# Patient Record
Sex: Female | Born: 1960 | Hispanic: Yes | Marital: Single | Smoking: Never smoker
Health system: Southern US, Community
[De-identification: ages and names within clinical notes are randomized; demographics above are authoritative.]

## PROBLEM LIST (undated history)

## (undated) DIAGNOSIS — E119 Type 2 diabetes mellitus without complications: Secondary | ICD-10-CM

## (undated) HISTORY — PX: EYE SURGERY: SHX253

## (undated) HISTORY — DX: Type 2 diabetes mellitus without complications: E11.9

---

## 2008-02-03 ENCOUNTER — Ambulatory Visit: Payer: Self-pay

## 2009-10-01 ENCOUNTER — Emergency Department: Payer: Self-pay | Admitting: Emergency Medicine

## 2011-08-13 ENCOUNTER — Emergency Department: Payer: Self-pay | Admitting: Internal Medicine

## 2012-02-04 ENCOUNTER — Ambulatory Visit: Payer: Self-pay

## 2014-07-04 ENCOUNTER — Ambulatory Visit: Payer: Self-pay

## 2017-01-27 ENCOUNTER — Ambulatory Visit: Payer: Self-pay | Attending: Oncology

## 2017-01-27 ENCOUNTER — Encounter (INDEPENDENT_AMBULATORY_CARE_PROVIDER_SITE_OTHER): Payer: Self-pay

## 2017-01-27 VITALS — BP 135/76 | HR 67 | Temp 98.7°F | Ht 62.0 in | Wt 174.0 lb

## 2017-01-27 DIAGNOSIS — N63 Unspecified lump in unspecified breast: Secondary | ICD-10-CM

## 2017-01-27 NOTE — Progress Notes (Signed)
Subjective:     Patient ID: Amber Marshall, female   DOB: 07/19/1961, 56 y.o.   MRN: [id number]  HPI   Review of Systems     Objective:   Physical Exam  Pulmonary/Chest: Right breast exhibits no inverted nipple, no mass, no nipple discharge, no skin change and no tenderness. Left breast exhibits tenderness. Left breast exhibits no inverted nipple, no mass, no nipple discharge and no skin change. Breasts are symmetrical.    Left axillary nodularity       Assessment:     56 year old hispanic female patient presents for BCCCP clinic visitPatient screened, and meets BCCCP eligibility.  Patient does not have insurance, Medicare or Medicaid.  Handout given on Affordable Care Act.  Instructed patient on breast self-exam using teach back method.  Patient states she has had left axillary tenderness x 2 months. States her primary provider at [facility] felt mass under left arm, and requested diagnostic mammogram and ultrasound.  Palpated axillary nodularity bilateral, more prominent on left.    Plan:     To be scheduled by Joellyn Quailshristy Burton for bilateral diagnostic mammogram, and ultrasound.

## 2017-02-06 ENCOUNTER — Encounter: Payer: Self-pay | Admitting: Radiology

## 2017-02-06 ENCOUNTER — Ambulatory Visit
Admission: RE | Admit: 2017-02-06 | Discharge: 2017-02-06 | Disposition: A | Discharge status: Home / Self Care | Payer: Self-pay | Source: Ambulatory Visit | Attending: Oncology | Admitting: Oncology

## 2017-02-06 ENCOUNTER — Other Ambulatory Visit: Payer: Self-pay

## 2017-02-06 DIAGNOSIS — N63 Unspecified lump in unspecified breast: Secondary | ICD-10-CM

## 2017-02-10 ENCOUNTER — Ambulatory Visit
Admission: RE | Admit: 2017-02-10 | Discharge: 2017-02-10 | Disposition: A | Discharge status: Home / Self Care | Payer: Self-pay | Source: Ambulatory Visit | Attending: Oncology | Admitting: Oncology

## 2017-02-10 DIAGNOSIS — N63 Unspecified lump in unspecified breast: Secondary | ICD-10-CM

## 2017-02-10 HISTORY — PX: BREAST BIOPSY: SHX20

## 2017-02-13 LAB — SURGICAL PATHOLOGY

## 2017-02-14 ENCOUNTER — Telehealth: Payer: Self-pay | Admitting: *Deleted

## 2017-02-14 NOTE — Telephone Encounter (Addendum)
Called patient via the interpreter, Amber Marshall.  Left her a message to return my call.  I also called her emergency contact and asked her to have the patient give me a call.  I have her benign biopsy report showing a plasma cell infiltrate.  Patient needs referral back to primary care for further evaluation per radiology.  I have scheduled the patient an appointment to see Dr. Hessie DienerBender at [facility] on Monday at 11:00.  Patient returned my call.  Reviewed her pathology report and need for further evaluation with her primary care physician.  She is aware of her appointment on Monday at 11:00.  Per Sheralyn Boatmanoni, at [facility], they are able to view results in Epic.

## 2017-02-16 NOTE — Progress Notes (Signed)
Copy to HSIS.  Molli PoseySheena Lambert RN scheduled patient appointment with Primary physician at [facility] for follow-up.

## 2018-07-29 ENCOUNTER — Encounter (INDEPENDENT_AMBULATORY_CARE_PROVIDER_SITE_OTHER): Payer: Self-pay

## 2018-07-29 ENCOUNTER — Ambulatory Visit: Payer: Self-pay | Attending: Oncology

## 2018-07-29 ENCOUNTER — Ambulatory Visit
Admission: RE | Admit: 2018-07-29 | Discharge: 2018-07-29 | Disposition: A | Discharge status: Home / Self Care | Payer: Self-pay | Source: Ambulatory Visit | Attending: Oncology | Admitting: Oncology

## 2018-07-29 VITALS — BP 105/63 | HR 70 | Temp 96.9°F | Ht 62.5 in | Wt 178.5 lb

## 2018-07-29 DIAGNOSIS — Z0000 Encounter for general adult medical examination without abnormal findings: Secondary | ICD-10-CM | POA: Insufficient documentation

## 2018-07-29 NOTE — Progress Notes (Signed)
  Subjective:     Patient ID: Amber Marshall, female   DOB: 07/13/1961, 57 y.o.   MRN: [id number]  HPI   Review of Systems     Objective:   Physical Exam  Pulmonary/Chest: Right breast exhibits no inverted nipple, no mass, no nipple discharge, no skin change and no tenderness. Left breast exhibits no inverted nipple, no mass, no nipple discharge, no skin change and no tenderness. Breasts are symmetrical.       Assessment:     57 year [facility] hispanic [facility]  [employer] remote interpreter used for translation.  Patient had a benign left breast lymph node biopsy in June 2018.  She also [facility] benign [facility] and [facility].  She is scheduled for a one [facility].  Patient screened, and meets BCCCP eligibility.  Patient does not have insurance, Medicare or Medicaid.  Handout given on Affordable Care Act.  Instructed patient on breast self awareness using teach back method.  Clinical breast exam unremarkable.  No mass or lump palpated.  Risk Assessment    Risk Scores      07/29/2018   Last edited by: Alta Corningover, Melissa G, CMA   5-year risk: 0.7 %   Lifetime risk: 4.7 %             Plan:     Sent for bilateral screening mammogram.

## 2018-07-30 NOTE — Progress Notes (Unsigned)
Letter mailed from [facility] to notify of normal mammogram results.  Patient to return in one year for annual screening.  Copy to HSIS. 

## 2019-07-19 ENCOUNTER — Other Ambulatory Visit: Payer: Self-pay

## 2019-07-19 NOTE — Progress Notes (Signed)
Due to Covid 19 a televisit was used to obtain patient's health history.  Amber Marshall #[id number] from the language line used for interpretation.  Verbal consent given to be in our BCCCP program.  Patient denies any breast problems.  Last pap per appointment notes was 08/25/18 with results of LGSIL.  Patient is aware of her appointment to see Dr. Amalia Hailey tomorrow at 1:30 for colposcopy and possible biopsy.  She was instructed to go directly to the [facility] for her mammogram tomorrow at 10:00.  Risk Assessment    Risk Scores      07/20/2019 07/29/2018   Last edited by: Rico Junker, RN Dover, Laddie Aquas, CMA   5-year risk: 0.8 % 0.7 %   Lifetime risk: 4.6 % 4.7 %

## 2019-07-20 ENCOUNTER — Ambulatory Visit: Payer: Self-pay | Attending: Oncology

## 2019-07-20 ENCOUNTER — Other Ambulatory Visit: Payer: Self-pay | Admitting: Obstetrics and Gynecology

## 2019-07-20 ENCOUNTER — Ambulatory Visit
Admission: RE | Admit: 2019-07-20 | Discharge: 2019-07-20 | Disposition: A | Discharge status: Home / Self Care | Payer: Self-pay | Source: Ambulatory Visit | Attending: Oncology | Admitting: Oncology

## 2019-07-20 ENCOUNTER — Encounter: Payer: Self-pay | Admitting: Obstetrics and Gynecology

## 2019-07-20 ENCOUNTER — Other Ambulatory Visit: Payer: Self-pay

## 2019-07-20 ENCOUNTER — Ambulatory Visit (INDEPENDENT_AMBULATORY_CARE_PROVIDER_SITE_OTHER): Payer: Self-pay | Admitting: Obstetrics and Gynecology

## 2019-07-20 VITALS — BP 125/67 | HR 69 | Ht 62.0 in | Wt 179.5 lb

## 2019-07-20 DIAGNOSIS — Z00129 Encounter for routine child health examination without abnormal findings: Secondary | ICD-10-CM

## 2019-07-20 DIAGNOSIS — R87612 Low grade squamous intraepithelial lesion on cytologic smear of cervix (LGSIL): Secondary | ICD-10-CM

## 2019-07-20 DIAGNOSIS — Z1231 Encounter for screening mammogram for malignant neoplasm of breast: Secondary | ICD-10-CM | POA: Insufficient documentation

## 2019-07-20 NOTE — Progress Notes (Signed)
Referring Provider:  BCCCP  HPI:  Amber Marshall is a 58 y.o.  No obstetric history on file.  who presents today for evaluation and management of abnormal cervical cytology.    Dysplasia History:  ASCUS Pos HPV 2019.   LGSIL 12/20  ROS:  Pertinent items are noted in HPI.  OB History  No obstetric history on file.    History reviewed. No pertinent past medical history.  Past Surgical History:  Procedure Laterality Date  . BREAST BIOPSY Left 02/10/2017   left axilla bx REACTIVE LYMPHADENOPATHY WITH PLASMA CELL INFILTRATE. NEGATIVE FOR MALIGNANCY     SOCIAL HISTORY: Social History   Substance and Sexual Activity  Alcohol Use Not Currently   Social History   Substance and Sexual Activity  Drug Use Not Currently     History reviewed. No pertinent family history.  ALLERGIES:  Patient has no allergy information on record.  She currently has no medications in their medication list.  Physical Exam: -Vitals:  BP 125/67   Pulse 69   Ht 5\' 2"  (1.575 m)   Wt 179 lb 8 oz (81.4 kg)   BMI 32.83 kg/m  GEN: WD, WN, NAD.  A+ O x 3, good mood and affect.   PROCEDURE: 1.  Urine Pregnancy Test:  not done 2.  Colposcopy performed with 4% acetic acid after verbal consent obtained                           -Aceto-white Lesions Location(s): Circumferential              -Biopsy performed at 5&10 o'clock               -ECC indicated and performed: No.     -Biopsy sites made hemostatic with pressure and Monsel's solution   -Satisfactory colposcopy: Yes.      -Evidence of Invasive cervical CA :  NO  ASSESSMENT:  Amber Marshall is a 58 y.o. No obstetric history on file. here for  1. Low grade squamous intraepithelial lesion on cytologic smear of cervix (LGSIL)   .  PLAN: 1.  I discussed the grading system of pap smears and HPV high risk viral types.  We will discuss management after colpo results return.  No orders of the defined types were placed in this  encounter.          F/U  Return in about 2 weeks (around 08/03/2019) for Colpo f/u.  Jeannie Fend ,MD 07/20/2019,2:11 PM

## 2019-07-23 NOTE — Progress Notes (Signed)
Letter mailed from [facility] to notify of normal mammogram results.  Patient to return in one year for annual screening.  Results from cervical biopsy pending.  Patient scheduled to return 08/02/20 for screening and follow-pap per Dr. Amalia Hailey recommendation post benign biopsy results.

## 2019-07-26 LAB — ANATOMIC PATHOLOGY REPORT: PDF Image: 0

## 2019-08-04 ENCOUNTER — Encounter: Payer: Self-pay | Admitting: Obstetrics and Gynecology

## 2019-08-04 ENCOUNTER — Other Ambulatory Visit: Payer: Self-pay

## 2019-08-04 ENCOUNTER — Ambulatory Visit (INDEPENDENT_AMBULATORY_CARE_PROVIDER_SITE_OTHER): Payer: PRIVATE HEALTH INSURANCE | Admitting: Obstetrics and Gynecology

## 2019-08-04 VITALS — BP 113/64 | HR 67 | Wt 173.3 lb

## 2019-08-04 DIAGNOSIS — R87612 Low grade squamous intraepithelial lesion on cytologic smear of cervix (LGSIL): Secondary | ICD-10-CM

## 2019-08-04 NOTE — Progress Notes (Signed)
HPI:      [name] is a 58 y.o. No obstetric history on file. who LMP was No LMP recorded. Patient is postmenopausal.  Subjective:   She presents today for follow-up of her colposcopy for LGSIL.  1 year ago she had positive HPV.  Although she had some acetowhite changes noted on colposcopy it was not significantly abnormal.    Hx: The following portions of the patient's history were reviewed and updated as appropriate:             She  has no past medical history on file. She does not have a problem list on file. She  has a past surgical history that includes Breast biopsy (Left, 02/10/2017). Her family history is not on file. She  reports that she has never smoked. She has never used smokeless tobacco. She reports previous alcohol use. She reports previous drug use. She currently has no medications in their medication list. She has no allergies on file.       Review of Systems:  Review of Systems  Constitutional: Denied constitutional symptoms, night sweats, recent illness, fatigue, fever, insomnia and weight loss.  Eyes: Denied eye symptoms, eye pain, photophobia, vision change and visual disturbance.  Ears/Nose/Throat/Neck: Denied ear, nose, throat or neck symptoms, hearing loss, nasal discharge, sinus congestion and sore throat.  Cardiovascular: Denied cardiovascular symptoms, arrhythmia, chest pain/pressure, edema, exercise intolerance, orthopnea and palpitations.  Respiratory: Denied pulmonary symptoms, asthma, pleuritic pain, productive sputum, cough, dyspnea and wheezing.  Gastrointestinal: Denied, gastro-esophageal reflux, melena, nausea and vomiting.  Genitourinary: Denied genitourinary symptoms including symptomatic vaginal discharge, pelvic relaxation issues, and urinary complaints.  Musculoskeletal: Denied musculoskeletal symptoms, stiffness, swelling, muscle weakness and myalgia.  Dermatologic: Denied dermatology symptoms, rash and scar.  Neurologic:  Denied neurology symptoms, dizziness, headache, neck pain and syncope.  Psychiatric: Denied psychiatric symptoms, anxiety and depression.  Endocrine: Denied endocrine symptoms including hot flashes and night sweats.   Meds:   No current outpatient medications on file prior to visit.   No current facility-administered medications on file prior to visit.    Objective:     Vitals:   08/04/19 1350  BP: 113/64  Pulse: 67              Colposcopically directed biopsies revealed no abnormalities/no dysplasia.  Assessment:    No obstetric history on file. There are no problems to display for this patient.    1. Low grade squamous intraepithelial lesion on cytologic smear of cervix (LGSIL)     Colposcopy negative for dysplasia   Plan:            1.  Recommend follow-up Pap smear in 1 year with HPV testing at that time.  Will refer back to [facility] for follow-up Pap.   Orders No orders of the defined types were placed in this encounter.   No orders of the defined types were placed in this encounter.     F/U  No follow-ups on file. I spent 18 minutes involved in the care of this patient of which greater than 50% was spent discussing history of abnormal Pap smears, findings at colposcopy recommended future follow-ups the relationship of HPV and cervical cancer/dysplasia.  All questions answered.  Finis Bud, M.D. 08/04/2019 1:55 PM

## 2019-08-04 NOTE — Progress Notes (Signed)
Patient comes in today for colpo results.  

## 2019-11-05 ENCOUNTER — Ambulatory Visit: Payer: Self-pay

## 2019-11-06 ENCOUNTER — Ambulatory Visit: Payer: Self-pay | Attending: Internal Medicine

## 2019-11-06 DIAGNOSIS — Z23 Encounter for immunization: Secondary | ICD-10-CM

## 2019-11-06 NOTE — Progress Notes (Signed)
   Covid-19 Vaccination Clinic  Name:  Niveah Boerner    MRN: [id number] DOB: 20-Jan-1961  11/06/2019  Ms. Lourie Retz was observed post Covid-19 immunization for 15 minutes without incident. She was provided with Vaccine Information Sheet and instruction to access the V-Safe system.   Ms. Sahra Converse was instructed to call 911 with any severe reactions post vaccine: Marland Kitchen Difficulty breathing  . Swelling of face and throat  . A fast heartbeat  . A bad rash all over body  . Dizziness and weakness   Immunizations Administered    Name Date Dose VIS Date Route   Pfizer COVID-19 Vaccine 11/06/2019  3:54 PM 0.3 mL 07/30/2019 Intramuscular   Manufacturer: [employer], [employer]   Lot: [id number]   NDC: [address]-8

## 2019-11-27 ENCOUNTER — Ambulatory Visit: Payer: Self-pay | Attending: Internal Medicine

## 2019-11-27 DIAGNOSIS — Z23 Encounter for immunization: Secondary | ICD-10-CM

## 2019-11-27 NOTE — Progress Notes (Signed)
   Covid-19 Vaccination Clinic  Name:  Kameah Rawl    MRN: [id number] DOB: 13-Jun-1961  11/27/2019  Ms. Lourie Retz was observed post Covid-19 immunization for 15 minutes without incident. She was provided with Vaccine Information Sheet and instruction to access the V-Safe system. Medical Interpreter used.  Ms. Dellanira Dillow was instructed to call 911 with any severe reactions post vaccine: Marland Kitchen Difficulty breathing  . Swelling of face and throat  . A fast heartbeat  . A bad rash all over body  . Dizziness and weakness   Immunizations Administered    Name Date Dose VIS Date Route   Pfizer COVID-19 Vaccine 11/27/2019  3:52 PM 0.3 mL 07/30/2019 Intramuscular   Manufacturer: [employer], [employer]   Lot: [id number]   NDC: [address]-5

## 2020-08-02 ENCOUNTER — Ambulatory Visit
Admission: RE | Admit: 2020-08-02 | Discharge: 2020-08-02 | Disposition: A | Discharge status: Home / Self Care | Payer: Self-pay | Source: Ambulatory Visit | Attending: Oncology | Admitting: Oncology

## 2020-08-02 ENCOUNTER — Ambulatory Visit: Payer: Self-pay | Attending: Oncology

## 2020-08-02 ENCOUNTER — Other Ambulatory Visit: Payer: Self-pay

## 2020-08-02 VITALS — BP 125/49 | HR 67 | Temp 98.3°F | Ht 61.16 in | Wt 176.2 lb

## 2020-08-02 DIAGNOSIS — Z0000 Encounter for general adult medical examination without abnormal findings: Secondary | ICD-10-CM

## 2020-08-02 NOTE — Progress Notes (Signed)
  Subjective:     Patient ID: Amber Marshall, female   DOB: Apr 11, 1961, 59 y.o.   MRN: [id number]  HPI   Review of Systems     Objective:   Physical Exam Chest:  Breasts:     Right: No swelling, bleeding, inverted nipple, mass, nipple discharge, skin change or tenderness.     Left: No swelling, bleeding, inverted nipple, mass, nipple discharge, skin change or tenderness.    Genitourinary:    Labia:        Right: No rash, tenderness, lesion or injury.        Left: No rash, tenderness, lesion or injury.      Vagina: No signs of injury and foreign body. No vaginal discharge, erythema, tenderness, bleeding, lesions or prolapsed vaginal walls.     Cervix: Discharge present. No cervical motion tenderness, friability, lesion, erythema, cervical bleeding or eversion.     Uterus: Not deviated, not enlarged, not fixed, not tender and no uterine prolapse.      Adnexa:        Right: No mass, tenderness or fullness.         Left: No mass, tenderness or fullness.       Comments: Light yellow discharge, non odorous.         Assessment:   59 year old Hispanic patient presents for BCCCP clinic visit.  Clinical breast exam unremarkable.  Patient screened, and meets BCCCP eligibility.  Patient does not have insurance, Medicare or Medicaid.   Instructed patient on breast self awareness using teach back method.  Clinical breast exam unremarkable.  No mass or lump palpated.  Pelvic exam normal. Risk Assessment    Risk Scores      08/02/2020 07/20/2019   Last edited by: Jim Like, RN Jim Like, RN   5-year risk: 0.8 % 0.8 %   Lifetime risk: 4.5 % 4.6 %            Plan:  Sent for bilateral screening mammogram.  Specimen collected for pap.

## 2020-08-07 LAB — IGP, APTIMA HPV: HPV Aptima: NEGATIVE

## 2020-09-27 NOTE — Progress Notes (Signed)
Letter mailed to patient to notify of normal mammogram.  Per ASCCP guidelines and Dr. Logan Bores recommendation, patient will require pap in one year due to LSIL/HPV negative results. Copy to HSIS.

## 2021-11-20 ENCOUNTER — Ambulatory Visit: Payer: Self-pay

## 2022-01-09 ENCOUNTER — Other Ambulatory Visit: Payer: Self-pay

## 2022-01-09 DIAGNOSIS — Z1211 Encounter for screening for malignant neoplasm of colon: Secondary | ICD-10-CM

## 2022-01-09 DIAGNOSIS — Z1231 Encounter for screening mammogram for malignant neoplasm of breast: Secondary | ICD-10-CM

## 2022-01-16 ENCOUNTER — Ambulatory Visit: Payer: Self-pay | Attending: Hematology and Oncology | Admitting: *Deleted

## 2022-01-16 ENCOUNTER — Ambulatory Visit
Admission: RE | Admit: 2022-01-16 | Discharge: 2022-01-16 | Disposition: A | Discharge status: Home / Self Care | Payer: Self-pay | Source: Ambulatory Visit | Attending: Obstetrics and Gynecology | Admitting: Obstetrics and Gynecology

## 2022-01-16 VITALS — BP 120/56 | HR 67 | Resp 18 | Wt 179.0 lb

## 2022-01-16 DIAGNOSIS — Z01419 Encounter for gynecological examination (general) (routine) without abnormal findings: Secondary | ICD-10-CM

## 2022-01-16 DIAGNOSIS — Z1231 Encounter for screening mammogram for malignant neoplasm of breast: Secondary | ICD-10-CM | POA: Insufficient documentation

## 2022-01-16 NOTE — Progress Notes (Signed)
Amber Marshall is a 61 y.o. No obstetric history on file. female who presents to [facility] clinic today with no complaints.    Pap Smear: Pap smear completed today. Last Pap smear was 08/02/2020 at [facility] clinic and was abnormal - LSIL with negative HPV . Per patient has history of two other abnormal Pap smears 08/25/2018 that was LSIL with negative HPV that a colposcopy was completed for follow up that showed CIN I and 01/20/2017 that was ASCUS with positive HPV that no follow up was completed. Last Pap smear result is available in Epic.   Physical exam: Breasts Breasts symmetrical. No skin abnormalities bilateral breasts. No nipple retraction bilateral breasts. No nipple discharge bilateral breasts. No lymphadenopathy. No lumps palpated bilateral breasts. No complaints of pain or tenderness on exam.  MS DIGITAL SCREENING TOMO BILATERAL  Result Date: 08/07/2020 CLINICAL DATA:  Screening. EXAM: DIGITAL SCREENING BILATERAL MAMMOGRAM WITH TOMO AND CAD COMPARISON:  Previous exam(s). ACR Breast Density Category b: There are scattered areas of fibroglandular density. FINDINGS: There are no findings suspicious for malignancy. Images were processed with CAD. IMPRESSION: No mammographic evidence of malignancy. A result letter of this screening mammogram will be mailed directly to the patient. RECOMMENDATION: Screening mammogram in one year. (Code:SM-B-01Y) BI-RADS CATEGORY  1: Negative. Electronically Signed   By: Norva Pavlov M.D.   On: 08/07/2020 11:04   MS DIGITAL SCREENING TOMO BILATERAL  Result Date: 07/20/2019 CLINICAL DATA:  Screening. EXAM: DIGITAL SCREENING BILATERAL MAMMOGRAM WITH TOMO AND CAD COMPARISON:  Previous exam(s). ACR Breast Density Category b: There are scattered areas of fibroglandular density. FINDINGS: There are no findings suspicious for malignancy. Images were processed with CAD. IMPRESSION: No mammographic evidence of malignancy. A result letter of this screening  mammogram will be mailed directly to the patient. RECOMMENDATION: Screening mammogram in one year. (Code:SM-B-01Y) BI-RADS CATEGORY  1: Negative. Electronically Signed   By: Edwin Cap M.D.   On: 07/20/2019 12:49   MS DIGITAL SCREENING TOMO BILATERAL  Result Date: 07/30/2018 CLINICAL DATA:  Screening. EXAM: DIGITAL SCREENING BILATERAL MAMMOGRAM WITH TOMO AND CAD COMPARISON:  Previous exam(s). ACR Breast Density Category b: There are scattered areas of fibroglandular density. FINDINGS: There are no findings suspicious for malignancy. Images were processed with CAD. IMPRESSION: No mammographic evidence of malignancy. A result letter of this screening mammogram will be mailed directly to the patient. RECOMMENDATION: Screening mammogram in one year. (Code:SM-B-01Y) BI-RADS CATEGORY  1: Negative. Electronically Signed   By: Gerome Sam III M.D   On: 07/30/2018 07:47   MS DIGITAL DIAG TOMO BILAT  Result Date: 02/06/2017 CLINICAL DATA:  Pain in the upper outer left breast felt by the patient. Thickening in this region felt by the patient's physician. EXAM: 2D DIGITAL DIAGNOSTIC BILATERAL MAMMOGRAM WITH CAD AND ADJUNCT TOMO ULTRASOUND LEFT BREAST COMPARISON:  Previous exam(s). ACR Breast Density Category b: There are scattered areas of fibroglandular density. FINDINGS: No mammographic changes. Bilateral asymmetries seen superiorly on the MLO views are stable. Mammographic images were processed with CAD. On physical exam, no suspicious lumps are identified. Targeted ultrasound is performed, showing an abnormal lymph node in the region of the patient's pain with a thickened cortex measuring up to 7 mm. IMPRESSION: There is a lymph node with a thickened cortex in the region of the patient's pain in the region of thickening described by the patient's physician. No other abnormalities. The patient has had no known recent infection or inflammation. RECOMMENDATION: Recommend ultrasound-guided biopsy of the  abnormal lymph node. I have discussed the findings and recommendations with the patient. Results were also provided in writing at the conclusion of the visit. If applicable, a reminder letter will be sent to the patient regarding the next appointment. BI-RADS CATEGORY  4: Suspicious. Electronically Signed   By: Gerome Sam III M.D   On: 02/06/2017 10:49   MS DIGITAL DIAG UNI LEFT  Result Date: 02/10/2017 CLINICAL DATA:  Patient status post ultrasound-guided biopsy cortically thickened left axillary lymph node appear EXAM: DIAGNOSTIC LEFT MAMMOGRAM POST ULTRASOUND BIOPSY COMPARISON:  Previous exam(s). FINDINGS: Mammographic images were obtained following ultrasound guided biopsy of cortically thickened left axillary lymph node. HydroMARK clip in appropriate position. IMPRESSION: Appropriate position HydroMARK clip status post ultrasound-guided biopsy cortically thickened left axillary lymph node. Final Assessment: Post Procedure Mammograms for Marker Placement Electronically Signed   By: Annia Belt M.D.   On: 02/10/2017 09:05        Pelvic/Bimanual Ext Genitalia No lesions, no swelling and no discharge observed on external genitalia.        Vagina Vagina pink and normal texture. No lesions or discharge observed in vagina.        Cervix Cervix is present. Cervix pink and of normal texture. No discharge observed. Cervix friable.   Uterus Uterus is present and palpable. Uterus in normal position and normal size.        Adnexae Bilateral ovaries present and palpable. No tenderness on palpation.         Rectovaginal No rectal exam completed today since patient had no rectal complaints. No skin abnormalities observed on exam.     Smoking History: Patient has never smoked.   Patient Navigation: Patient education provided. Access to services provided for patient through [employer] program. Spanish interpreter Delos Haring from [facility] provided.   Colorectal Cancer Screening: Per  patient has never had colonoscopy completed. FIT test given to patient to complete. No complaints today.    Breast and Cervical Cancer Risk Assessment: Patient does not have family history of breast cancer, known genetic mutations, or radiation treatment to the chest before age 62. Patient has history of cervical dysplasia. Patient has no history of being immunocompromised or DES exposure in-utero.  Risk Assessment     Risk Scores       01/16/2022 08/02/2020   Last edited by: Lesle Chris, RN Jim Like, RN   5-year risk: 0.8 % 0.8 %   Lifetime risk: 4.3 % 4.5 %            A: BCCCP exam with pap smear No complaints.  P: Referred patient to the [facility] for a screening mammogram. Appointment scheduled Wednesday, Jan 16, 2022 at 1100.  Priscille Heidelberg, RN 01/16/2022 11:09 AM

## 2022-01-16 NOTE — Patient Instructions (Signed)
Explained breast self awareness with Octavia Heir. Pap smear completed today. Let patient know if today's Pap smear is normal and HPV negative that her next Pap smear is due in one year due to her history of abnormal Pap smears. Referred patient to the [facility] for a screening mammogram. Appointment scheduled Wednesday, Jan 16, 2022 at 1100. Patient aware of appointment and will be there. Let patient know will follow up with her within the next couple weeks with results of Pap smear by phone. Informed patient that Delford Field will follow up with her within the next couple of weeks with results of her mammogram by letter or phone. Octavia Heir verbalized understanding.  Kina Shiffman, Kathaleen Maser, RN 11:09 AM

## 2022-01-18 LAB — CYTOLOGY - PAP
Comment: NEGATIVE
Diagnosis: NEGATIVE
High risk HPV: NEGATIVE

## 2022-02-11 ENCOUNTER — Telehealth: Payer: Self-pay

## 2022-07-08 ENCOUNTER — Other Ambulatory Visit: Payer: Self-pay | Admitting: Physician Assistant

## 2022-07-08 ENCOUNTER — Ambulatory Visit
Admission: RE | Admit: 2022-07-08 | Discharge: 2022-07-08 | Disposition: A | Discharge status: Home / Self Care | Payer: Self-pay | Attending: Physician Assistant | Admitting: Physician Assistant

## 2022-07-08 ENCOUNTER — Ambulatory Visit
Admission: RE | Admit: 2022-07-08 | Discharge: 2022-07-08 | Disposition: A | Discharge status: Home / Self Care | Payer: Self-pay | Source: Ambulatory Visit | Attending: Physician Assistant | Admitting: Physician Assistant

## 2022-07-08 DIAGNOSIS — M542 Cervicalgia: Secondary | ICD-10-CM

## 2022-07-08 DIAGNOSIS — M545 Low back pain, unspecified: Secondary | ICD-10-CM | POA: Insufficient documentation

## 2022-07-08 DIAGNOSIS — M546 Pain in thoracic spine: Secondary | ICD-10-CM

## 2024-01-13 ENCOUNTER — Telehealth: Payer: Self-pay | Admitting: *Deleted

## 2024-02-18 ENCOUNTER — Encounter: Payer: Self-pay | Admitting: Internal Medicine

## 2024-02-19 ENCOUNTER — Telehealth: Payer: Self-pay | Admitting: *Deleted

## 2024-02-21 IMAGING — MG MM DIGITAL SCREENING BILAT W/ TOMO AND CAD
6 of 10 series · 6 of 30 positions shown · non-contrast
Comparison: Previous exam(s).

CLINICAL DATA: Screening.

EXAM:
DIGITAL SCREENING BILATERAL MAMMOGRAM WITH TOMOSYNTHESIS AND CAD
TECHNIQUE: Bilateral screening digital craniocaudal and mediolateral oblique
mammograms were obtained. Bilateral screening digital breast
tomosynthesis was performed. The images were evaluated with
computer-aided detection.

[R CC synth-2D (1 of 2)]
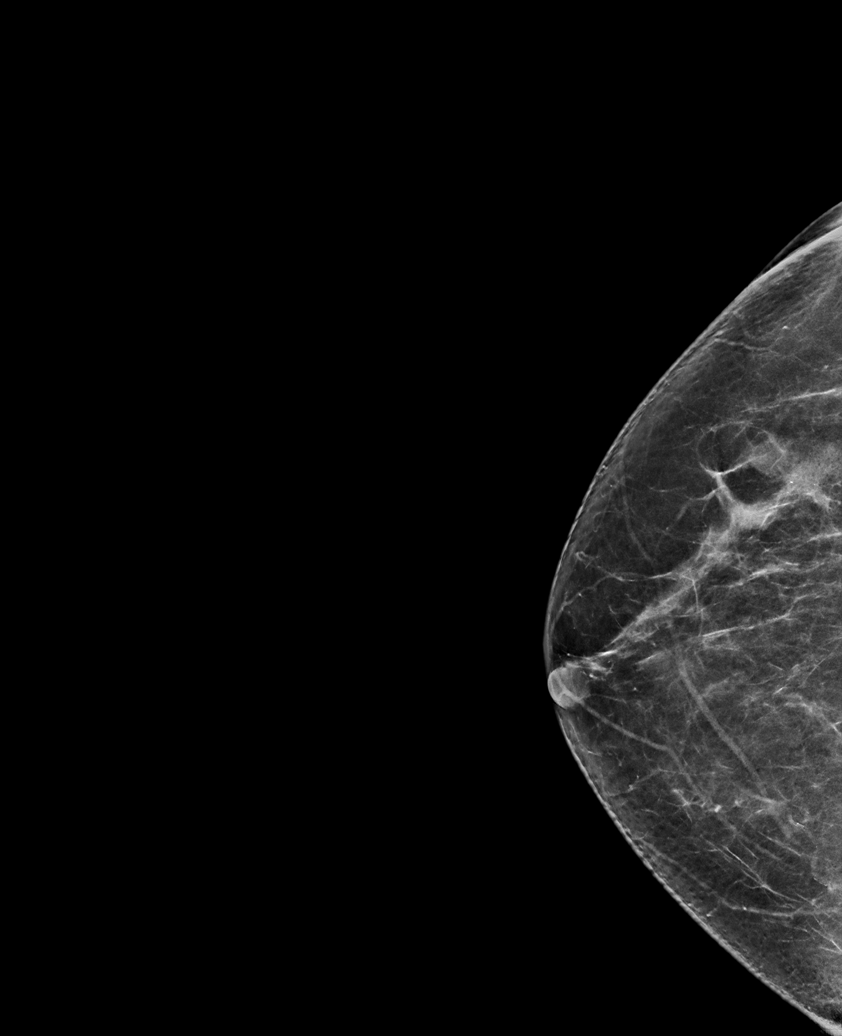

[L CC synth-2D]
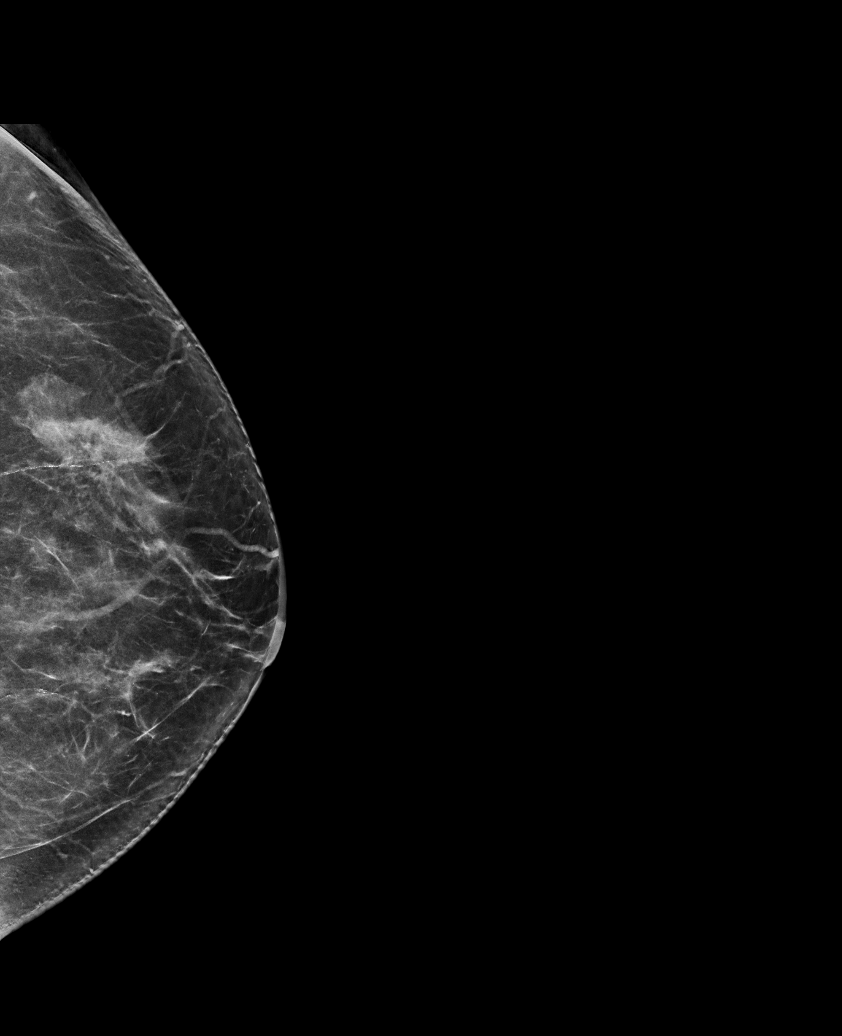

[R MLO synth-2D]
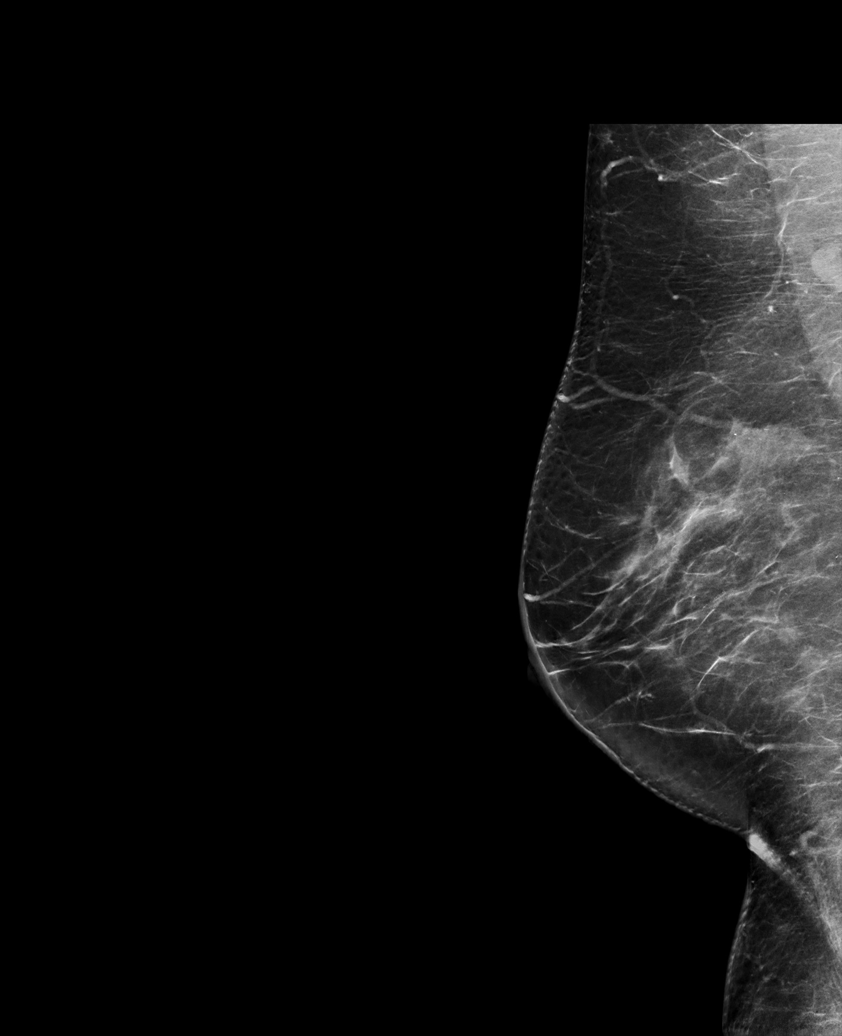

[R CC synth-2D (2 of 2)]
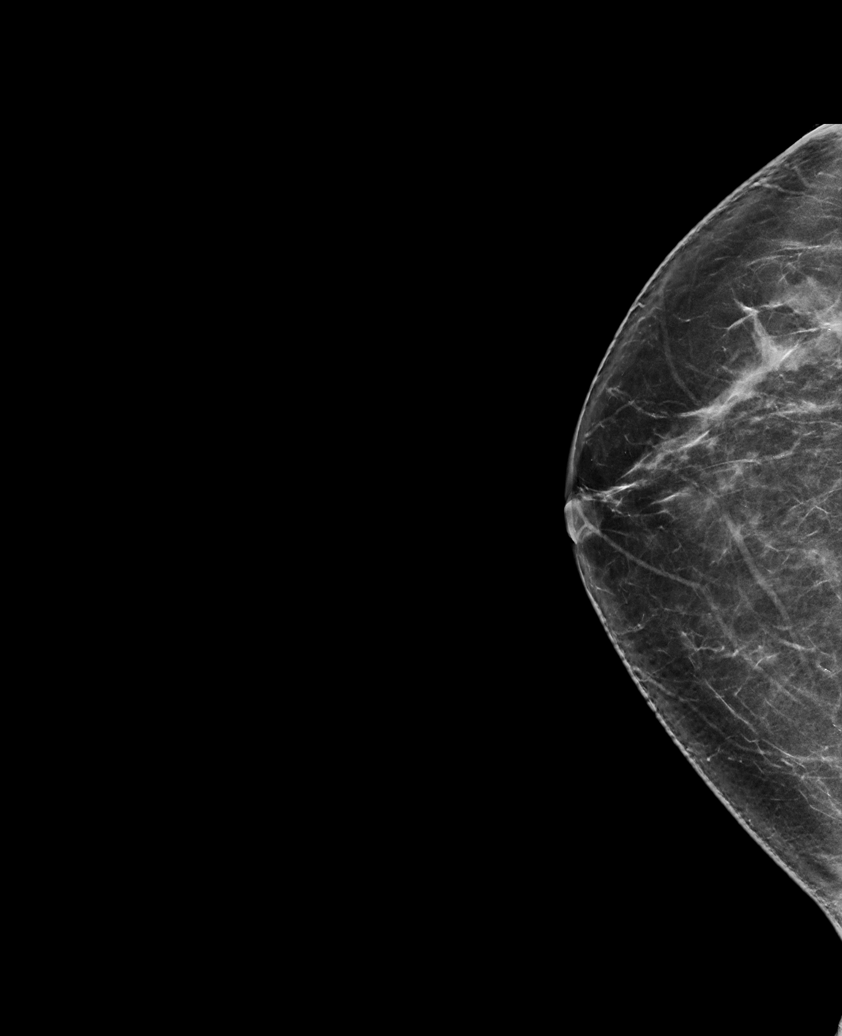

[L MLO synth-2D]
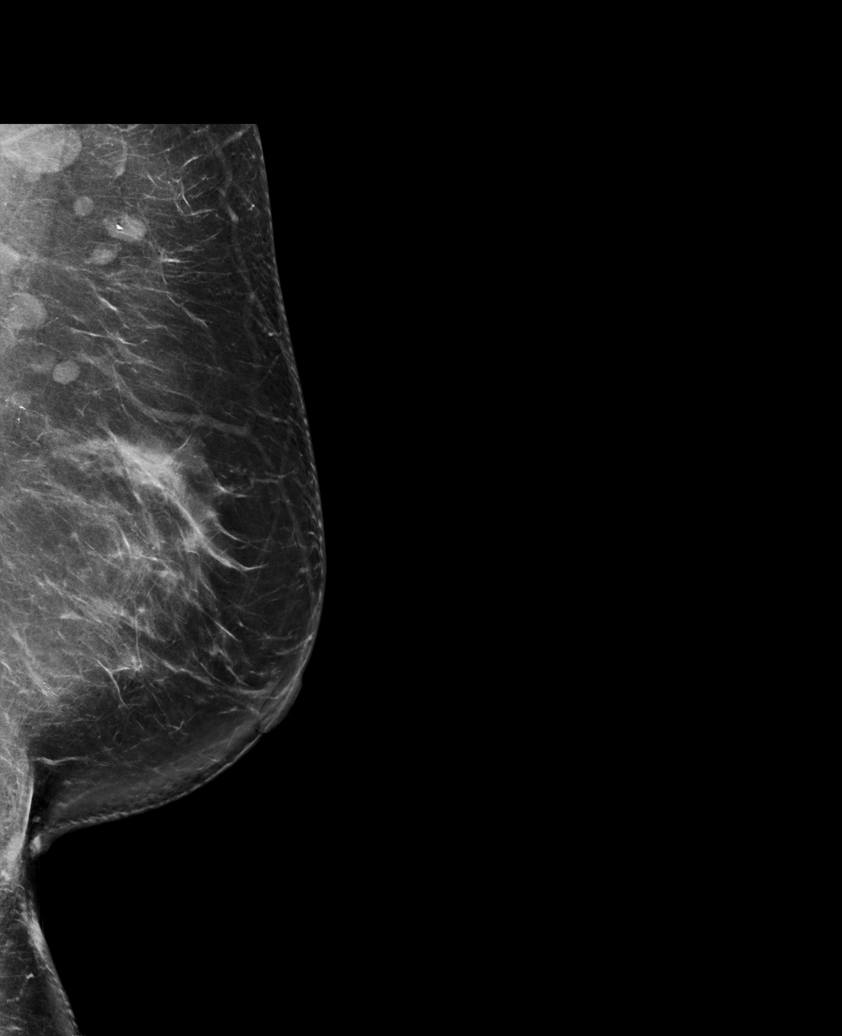

[L MLO tomo · tomo slice 46/91.0]
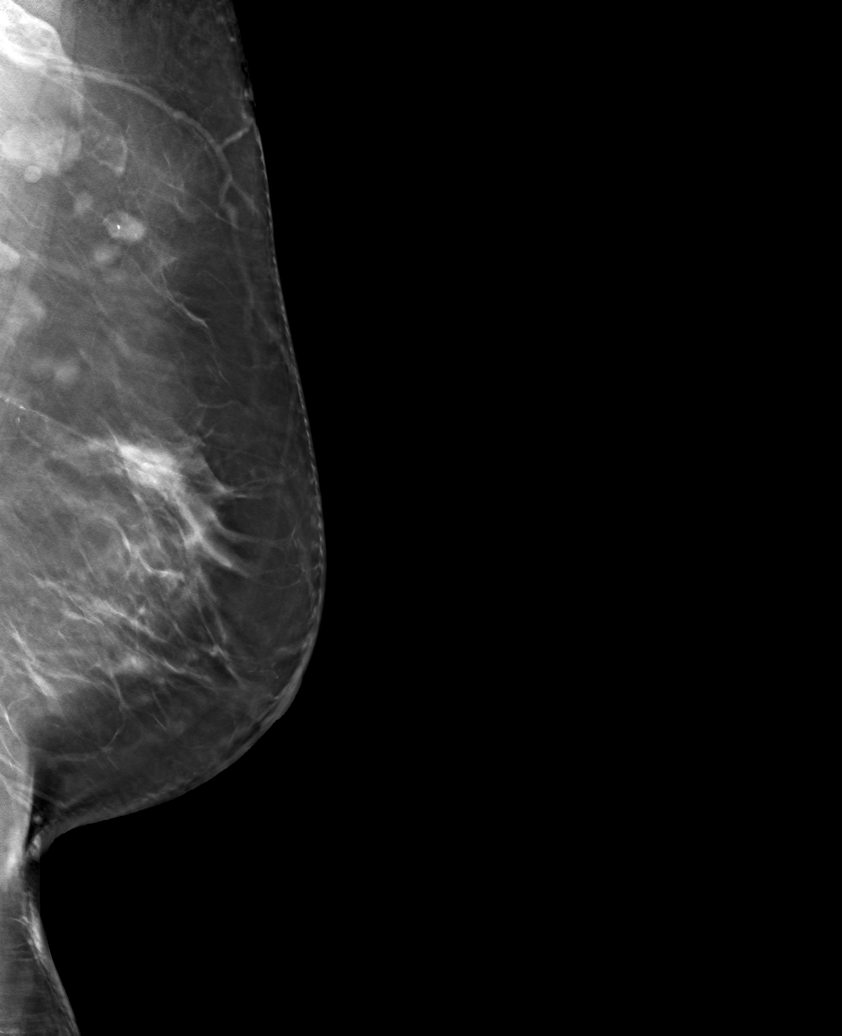

[6 of 30 positions shown; findings below may reference images not displayed]

ACR Breast Density Category b: There are scattered areas of
fibroglandular density.
FINDINGS: There are no findings suspicious for malignancy.
IMPRESSION: No mammographic evidence of malignancy. A result letter of this
screening mammogram will be mailed directly to the patient.

RECOMMENDATION:
Screening mammogram in one year. (Code:51-O-LD2)

BI-RADS CATEGORY  1: Negative.

## 2024-03-22 ENCOUNTER — Other Ambulatory Visit: Payer: Self-pay

## 2024-03-22 DIAGNOSIS — N644 Mastodynia: Secondary | ICD-10-CM

## 2024-05-10 ENCOUNTER — Ambulatory Visit
Admission: RE | Admit: 2024-05-10 | Discharge: 2024-05-10 | Disposition: A | Discharge status: Home / Self Care | Payer: Self-pay | Source: Ambulatory Visit | Attending: Obstetrics and Gynecology | Admitting: Obstetrics and Gynecology

## 2024-05-10 ENCOUNTER — Ambulatory Visit: Payer: Self-pay | Attending: Obstetrics and Gynecology | Admitting: *Deleted

## 2024-05-10 ENCOUNTER — Other Ambulatory Visit: Payer: Self-pay

## 2024-05-10 VITALS — BP 117/49 | Ht 61.0 in | Wt 177.2 lb

## 2024-05-10 DIAGNOSIS — N644 Mastodynia: Secondary | ICD-10-CM | POA: Insufficient documentation

## 2024-05-10 DIAGNOSIS — Z124 Encounter for screening for malignant neoplasm of cervix: Secondary | ICD-10-CM

## 2024-05-10 DIAGNOSIS — Z01419 Encounter for gynecological examination (general) (routine) without abnormal findings: Secondary | ICD-10-CM

## 2024-05-10 NOTE — Patient Instructions (Signed)
 Explained breast self awareness with Hadassah Marit Ray Robinette. Pap smear completed today. Let patient know if today's Pap smear is normal and HPV negative then her next Pap smear will be due in one year due to her history of abnormal Pap smears. Referred patient to the [facility] for a diagnostic mammogram. Appointment scheduled Monday, May 10, 2024 at 1340. Patient aware of appointment and will be there. Let patient know will follow up with her within the next couple weeks with results of Pap smear by letter or phone. Hadassah Marit Ray Robinette verbalized understanding.  Jya Hughston, Wanda Ship, RN 12:45 PM

## 2024-05-10 NOTE — Progress Notes (Addendum)
 Amber Marshall is a 63 y.o. No obstetric history on file. female who presents to [facility] clinic today with complaint of left lower breast pain that lasted two weeks and resolved 2 weeks ago after she got a new bra without the underwire.    Pap Smear: Pap smear completed today. Last Pap smear was 01/16/2022 at [facility] clinic and was normal with negative HPV. Per patient has history of three abnormal Pap smears 08/02/2020 that was LSIL with negative, 08/25/2018 that was LSIL with negative HPV that a colposcopy was completed for follow up that showed CIN I and 01/20/2017 that was ASCUS with positive HPV that no follow up was completed . Last Pap smear result is available in Epic.   Physical exam: Breasts Breasts symmetrical. No skin abnormalities bilateral breasts. No nipple retraction bilateral breasts. No nipple discharge bilateral breasts. No lymphadenopathy. No lumps palpated bilateral breasts. Complaints of bilateral outer and left lower breast tenderness on exam.     MS DIGITAL SCREENING TOMO BILATERAL Result Date: 01/17/2022 CLINICAL DATA:  Screening. EXAM: DIGITAL SCREENING BILATERAL MAMMOGRAM WITH TOMOSYNTHESIS AND CAD TECHNIQUE: Bilateral screening digital craniocaudal and mediolateral oblique mammograms were obtained. Bilateral screening digital breast tomosynthesis was performed. The images were evaluated with computer-aided detection. COMPARISON:  Previous exam(s). ACR Breast Density Category b: There are scattered areas of fibroglandular density. FINDINGS: There are no findings suspicious for malignancy. IMPRESSION: No mammographic evidence of malignancy. A result letter of this screening mammogram will be mailed directly to the patient. RECOMMENDATION: Screening mammogram in one year. (Code:SM-B-01Y) BI-RADS CATEGORY  1: Negative. Electronically Signed   By: Lael Hines M.D.   On: 01/17/2022 14:41   MS DIGITAL SCREENING TOMO BILATERAL Result Date: 08/07/2020 CLINICAL DATA:   Screening. EXAM: DIGITAL SCREENING BILATERAL MAMMOGRAM WITH TOMO AND CAD COMPARISON:  Previous exam(s). ACR Breast Density Category b: There are scattered areas of fibroglandular density. FINDINGS: There are no findings suspicious for malignancy. Images were processed with CAD. IMPRESSION: No mammographic evidence of malignancy. A result letter of this screening mammogram will be mailed directly to the patient. RECOMMENDATION: Screening mammogram in one year. (Code:SM-B-01Y) BI-RADS CATEGORY  1: Negative. Electronically Signed   By: Almarie Daring M.D.   On: 08/07/2020 11:04   MS DIGITAL SCREENING TOMO BILATERAL Result Date: 07/20/2019 CLINICAL DATA:  Screening. EXAM: DIGITAL SCREENING BILATERAL MAMMOGRAM WITH TOMO AND CAD COMPARISON:  Previous exam(s). ACR Breast Density Category b: There are scattered areas of fibroglandular density. FINDINGS: There are no findings suspicious for malignancy. Images were processed with CAD. IMPRESSION: No mammographic evidence of malignancy. A result letter of this screening mammogram will be mailed directly to the patient. RECOMMENDATION: Screening mammogram in one year. (Code:SM-B-01Y) BI-RADS CATEGORY  1: Negative. Electronically Signed   By: Delon Music M.D.   On: 07/20/2019 12:49    Pelvic/Bimanual Ext Genitalia No lesions, no swelling and no discharge observed on external genitalia.        Vagina Vagina pink and normal texture. No lesions or discharge observed in vagina.        Cervix Cervix is present. Cervix pink and of normal texture. Cervix friable. No discharge observed.    Uterus Uterus is present and palpable. Uterus in normal position and normal size.        Adnexae Bilateral ovaries present and palpable. No tenderness on palpation.         Rectovaginal No rectal exam completed today since patient had no rectal complaints. No skin abnormalities observed on  exam.     Smoking History: Patient has never smoked.   Patient  Navigation: Patient education provided. Access to services provided for patient through [employer] program. Spanish interpreter Maritza Afanador from [facility] provided.   Colorectal Cancer Screening: Per patient has never had colonoscopy completed. Patient stated she completed a Cologuard test given by her PCP at [facility] in March 2025. No complaints today.    Breast and Cervical Cancer Risk Assessment: Patient does not have family history of breast cancer, known genetic mutations, or radiation treatment to the chest before age 43. Patient has history of cervical dysplasia. Patient has no history of being immunocompromised or DES exposure in-utero.   Risk Scores as of Encounter on 05/10/2024     Amber Marshall           5-year 1.34%   Lifetime 5.74%   This patient is [place] but has no documented birth country, so the [place] model used data from [place] patients to calculate their risk score. Document a birth country in the Demographics activity for a more accurate score.         Last calculated by Rogerio Tempie SQUIBB, LPN on 0/77/7974 at 12:47 PM       A: [facility] exam with pap smear Complaint of left lower breast pain.  P: Referred patient to the [facility] for a diagnostic mammogram. Appointment scheduled Monday, May 10, 2024 at 1340.  Driscilla Wanda SQUIBB, RN 05/10/2024 12:42 PM

## 2024-05-12 LAB — CYTOLOGY - PAP
Comment: NEGATIVE
Diagnosis: UNDETERMINED — AB
High risk HPV: NEGATIVE

## 2024-05-13 ENCOUNTER — Ambulatory Visit: Payer: Self-pay | Admitting: *Deleted

## 2024-05-13 NOTE — Progress Notes (Signed)
 Hello,  Per ASCCP guidelines patient needs colposcopy due to history.  Thanks, [employer]

## 2024-05-13 NOTE — Telephone Encounter (Addendum)
 Via, Amber Marshall, [facility] Spanish Interpeter, Patient informed pap results, ASC-US  with negative HPV, needs colposcopy due to history per guidelines. Patient was informed Amber Marshall, will be contacting her to schedule colposcopy with [facility]. Patient verbalized understanding.    ----- Message from Nurse Wanda NOVAK sent at 05/13/2024  8:58 AM EDT ----- Hello,  Per ASCCP guidelines patient needs colposcopy due to history.  Thanks, Wanda ----- Message ----- From: Interface, [facility] Sent: 05/12/2024   5:31 PM EDT To: [employer]

## 2024-06-09 ENCOUNTER — Ambulatory Visit: Payer: Self-pay | Admitting: Obstetrics & Gynecology

## 2024-06-09 ENCOUNTER — Other Ambulatory Visit (HOSPITAL_COMMUNITY)
Admission: RE | Admit: 2024-06-09 | Discharge: 2024-06-09 | Disposition: A | Discharge status: Home / Self Care | Payer: Self-pay | Source: Ambulatory Visit | Attending: Obstetrics & Gynecology | Admitting: Obstetrics & Gynecology

## 2024-06-09 VITALS — BP 111/55 | HR 63 | Ht 61.0 in | Wt 176.9 lb

## 2024-06-09 DIAGNOSIS — R87610 Atypical squamous cells of undetermined significance on cytologic smear of cervix (ASC-US): Secondary | ICD-10-CM | POA: Insufficient documentation

## 2024-06-09 DIAGNOSIS — Z603 Acculturation difficulty: Secondary | ICD-10-CM | POA: Insufficient documentation

## 2024-06-09 DIAGNOSIS — N870 Mild cervical dysplasia: Secondary | ICD-10-CM

## 2024-06-09 NOTE — Progress Notes (Signed)
    GYNECOLOGY PROGRESS NOTE  Subjective:    Patient ID: Amber Marshall, female    DOB: 1961-05-14, 63 y.o.   MRN: [id number]  HPI  Patient is a 63 y.o. Here for a colpo due to ASCUS negative HR HPV pap 04/2024 through [facility]. Her history is as follows: Pap smear was 01/16/2022 at [facility] clinic and was normal with negative HPV. Per patient has history of three abnormal Pap smears 08/02/2020 that was LSIL with negative, 08/25/2018 that was LSIL with negative HPV that a colposcopy was completed for follow up that showed CIN I and 01/20/2017 that was ASCUS with positive HPV that no follow up was completed   The following portions of the patient's history were reviewed and updated as appropriate: allergies, current medications, past family history, past medical history, past social history, past surgical history, and problem list.  Review of Systems Pertinent items are noted in HPI.   Objective:   Blood pressure (!) 111/55, pulse 63, height 5' 1 (1.549 m), weight 176 lb 14.4 oz (80.2 kg). Body mass index is 33.42 kg/m.  Interpretor present for exam Well nourished, well hydrated Latina, no apparent distress  Consent signed, time out done Speculum placed. Cervix prepped with acetic acid. Transformation zone seen in its entirety. Colpo adequate. Colposcopic findings normal with the possible exception of a prominent blood vessel at the 11 o'clock position ECC obtained. She tolerated the procedure well.    Assessment:   ASCUS negative HR HPV pap 04/2024 with h/o CIN 1 per patient  Plan:   Await pathology

## 2024-06-11 LAB — SURGICAL PATHOLOGY

## 2024-06-14 ENCOUNTER — Telehealth: Payer: Self-pay

## 2024-06-14 NOTE — Telephone Encounter (Signed)
-----   Message from Harland JAYSON Birkenhead sent at 06/14/2024  7:22 AM EDT ----- Please let her know that she needs a LEEP. Thanks

## 2024-06-14 NOTE — Telephone Encounter (Signed)
 Called pt to schedule LEEP, no answer, LVMTRC.

## 2024-06-15 NOTE — Telephone Encounter (Signed)
 Tried again, patient scheduled for LEEP on 08/03/24 at 9:15 am.

## 2024-08-03 ENCOUNTER — Encounter: Payer: Self-pay | Admitting: Obstetrics & Gynecology

## 2024-08-03 ENCOUNTER — Ambulatory Visit: Payer: Self-pay | Admitting: Obstetrics & Gynecology

## 2024-08-03 VITALS — BP 116/65 | HR 67 | Wt 172.0 lb

## 2024-08-03 NOTE — Progress Notes (Signed)
° ° °  GYNECOLOGY PROGRESS NOTE  Subjective:    Patient ID: Amber Marshall, female    DOB: [birth year], 63 y.o.   MRN: [id number]  HPI  Patient is a 63 y.o. G1P2002 female who presents for a LEEP due to CIN 1 seen on ECC done at time of colpo 06/09/2024.  Her pap history is as follows:   ASCUS negative HR HPV pap 04/2024 through [facility].  Pap smear was 01/16/2022 at [facility] clinic and was normal with negative HPV. Per patient has history of three abnormal Pap smears 08/02/2020 that was LSIL with negative, 08/25/2018 that was LSIL with negative HPV that a colposcopy was completed for follow up that showed CIN I and 01/20/2017 that was ASCUS with positive HPV that no follow up was completed    The following portions of the patient's history were reviewed and updated as appropriate: allergies, current medications, past family history, past medical history, past social history, past surgical history, and problem list.  Review of Systems   Objective:   Blood pressure 116/65, pulse 67, weight 172 lb (78 kg). Body mass index is 32.5 kg/m. Video interpretor present for exam The only options we have in stock for the Teflon speculae are 1L and 2L. These were too large to allow me access to her cervix.   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
